# Patient Record
Sex: Female | Born: 1975 | Race: Black or African American | Hispanic: No | Marital: Married | Smoking: Never smoker
Health system: Southern US, Community
[De-identification: ages and names within clinical notes are randomized; demographics above are authoritative.]

## PROBLEM LIST (undated history)

## (undated) DIAGNOSIS — I10 Essential (primary) hypertension: Secondary | ICD-10-CM

## (undated) DIAGNOSIS — Z87442 Personal history of urinary calculi: Secondary | ICD-10-CM

## (undated) HISTORY — PX: NASAL SINUS SURGERY: SHX719

## (undated) HISTORY — PX: BREAST EXCISIONAL BIOPSY: SUR124

## (undated) HISTORY — PX: BREAST SURGERY: SHX581

---

## 2015-05-02 ENCOUNTER — Emergency Department (HOSPITAL_COMMUNITY)
Admission: EM | Admit: 2015-05-02 | Discharge: 2015-05-02 | Disposition: A | Discharge status: Home / Self Care | Payer: TRICARE For Life (TFL) | Attending: Emergency Medicine | Admitting: Emergency Medicine

## 2015-05-02 ENCOUNTER — Encounter (HOSPITAL_COMMUNITY): Payer: Self-pay | Admitting: Cardiology

## 2015-05-02 ENCOUNTER — Emergency Department (HOSPITAL_COMMUNITY): Payer: TRICARE For Life (TFL)

## 2015-05-02 DIAGNOSIS — I10 Essential (primary) hypertension: Secondary | ICD-10-CM | POA: Diagnosis not present

## 2015-05-02 DIAGNOSIS — Z8719 Personal history of other diseases of the digestive system: Secondary | ICD-10-CM | POA: Diagnosis not present

## 2015-05-02 DIAGNOSIS — Z880 Allergy status to penicillin: Secondary | ICD-10-CM | POA: Insufficient documentation

## 2015-05-02 DIAGNOSIS — R079 Chest pain, unspecified: Secondary | ICD-10-CM | POA: Insufficient documentation

## 2015-05-02 DIAGNOSIS — M25512 Pain in left shoulder: Secondary | ICD-10-CM | POA: Diagnosis not present

## 2015-05-02 HISTORY — DX: Essential (primary) hypertension: I10

## 2015-05-02 LAB — CBC
HCT: 38.2 % (ref 36.0–46.0)
HEMOGLOBIN: 12 g/dL (ref 12.0–15.0)
MCH: 23.4 pg — ABNORMAL LOW (ref 26.0–34.0)
MCHC: 31.4 g/dL (ref 30.0–36.0)
MCV: 74.6 fL — ABNORMAL LOW (ref 78.0–100.0)
Platelets: 198 10*3/uL (ref 150–400)
RBC: 5.12 MIL/uL — AB (ref 3.87–5.11)
RDW: 15.1 % (ref 11.5–15.5)
WBC: 4.5 10*3/uL (ref 4.0–10.5)

## 2015-05-02 LAB — I-STAT TROPONIN, ED
Troponin i, poc: 0 ng/mL (ref 0.00–0.08)
Troponin i, poc: 0 ng/mL (ref 0.00–0.08)

## 2015-05-02 LAB — BASIC METABOLIC PANEL
ANION GAP: 11 (ref 5–15)
CHLORIDE: 107 mmol/L (ref 101–111)
CO2: 23 mmol/L (ref 22–32)
CREATININE: 0.7 mg/dL (ref 0.44–1.00)
Calcium: 9.1 mg/dL (ref 8.9–10.3)
GFR calc Af Amer: >60 mL/min (ref >60)
GFR calc non Af Amer: >60 mL/min (ref >60)
Glucose, Bld: 86 mg/dL (ref 65–99)
Potassium: 3.8 mmol/L (ref 3.5–5.1)
Sodium: 141 mmol/L (ref 135–145)

## 2015-05-02 LAB — D-DIMER, QUANTITATIVE: D-Dimer, Quant: 0.34 ug/mL-FEU (ref 0.00–0.48)

## 2015-05-02 MED ORDER — IBUPROFEN 400 MG PO TABS
600.0000 mg | ORAL_TABLET | Freq: Once | ORAL | Status: AC
  Administered 2015-05-02: 600 mg via ORAL
  Filled 2015-05-02 (×2): qty 1

## 2015-05-02 MED ORDER — HYDROCODONE-ACETAMINOPHEN 5-325 MG PO TABS: 2.0000 | ORAL_TABLET | ORAL | Status: DC | PRN

## 2015-05-02 NOTE — ED Notes (Signed)
RN and tech attempted to draw lab. Phlebotomy aware and will attempt

## 2015-05-02 NOTE — ED Provider Notes (Signed)
CSN: [id number]     Arrival date & time 05/02/15  0744 History   First MD Initiated Contact with Patient 05/02/15 [phone]     Chief Complaint  Patient presents with  . Chest Pain    HPI   38 year old female presents today with today with chest pain and shoulder pain. Patient reports that on Friday she began developing lower chest wall pain, this was not associated with shortness of breath, nausea, vomiting, diaphoresis, positioning, exertion. She reports that the pain has waxed and waned throughout the weekend but notes this morning upon awakening she had left scapular pain that radiated down her left arm. Patient denies physical activity or exacerbating event, no trauma to the shoulder, no previous history of this sort of pain. She denies aggravating or relieving factors, she does not smoke, use drugs, no significant cardiac medical history. Patient denies fever, shortness of breath, unilateral leg swelling, history of malignancy, smoking, prolonged immobilization, recent surgeries, coagulopathy, recent estrogen use. Patient does note a history of GERD, not currently taking her medication, reports burning sensation in her upper abdomen from time to time.     Past Medical History  Diagnosis Date  . Hypertension    History reviewed. No pertinent past surgical history. History reviewed. No pertinent family history. History  Substance Use Topics  . Smoking status: Never Smoker   . Smokeless tobacco: Not on file  . Alcohol Use: No   OB History    No data available     Review of Systems  All other systems reviewed and are negative.   Allergies  Contrast media; Penicillins; Reglan; and Rocephin  Home Medications   Prior to Admission medications   Not on File   BP 139/77 mmHg  Pulse 91  Temp(Src) 98 F (36.7 C) (Oral)  Resp 13  Ht  (1.626 m)  Wt 170 lb (77.111 kg)  BMI 29.17 kg/m2  SpO2 99%  LMP 05/01/2015 Physical Exam  Constitutional: She is oriented to person, place,  and time. She appears well-developed and well-nourished.  HENT:  Head: Normocephalic and atraumatic.  Eyes: Conjunctivae are normal. Pupils are equal, round, and reactive to light. Right eye exhibits no discharge. Left eye exhibits no discharge. No scleral icterus.  Neck: Normal range of motion. Neck supple. No JVD present. No tracheal deviation present.  Cardiovascular: Regular rhythm, normal heart sounds and intact distal pulses.  Exam reveals no gallop and no friction rub.   No murmur heard. Pulmonary/Chest: Effort normal and breath sounds normal. No stridor.  Abdominal: Soft. Bowel sounds are normal. She exhibits no distension and no mass. There is no tenderness. There is no rebound and no guarding.  Musculoskeletal: She exhibits no edema or tenderness.  Distal extremities equal bilateral no signs of swelling or edema. Left scapula and surrounding tissues is physically tender to palpation, no signs of trauma, rash  Neurological: She is alert and oriented to person, place, and time. She has normal strength. She displays no atrophy and no tremor. No cranial nerve deficit or sensory deficit. She exhibits normal muscle tone. She displays no seizure activity. Coordination and gait normal. GCS eye subscore is 4. GCS verbal subscore is 5. GCS motor subscore is 6.  Upper/ lower strength 5/5   Skin: Skin is warm and dry.  Psychiatric: She has a normal mood and affect. Her behavior is normal. Judgment and thought content normal.  Nursing note and vitals reviewed.   ED Course  Procedures (including critical care time) Labs Review  Labs Reviewed  CBC  BASIC METABOLIC PANEL  Rosezena SensorI-STAT TROPOININ, ED    Imaging Review Dg Chest 2 View  05/02/2015   CLINICAL DATA:  Left-sided chest pain  EXAM: CHEST  2 VIEW  COMPARISON:  None.  FINDINGS: Lungs are clear. Heart size and pulmonary vascularity are normal. No adenopathy. There is slight upper thoracic levoscoliosis. No pneumothorax.  IMPRESSION: No edema or  consolidation.   Electronically Signed   By: Bretta BangWilliam  Woodruff III M.D.   On: 05/02/2015 08:31     EKG Interpretation   Date/Time:  Tuesday May 02 2015 07:53:28 EDT Ventricular Rate:  106 PR Interval:  146 QRS Duration: 70 QT Interval:  348 QTC Calculation: 462 R Axis:   81 Text Interpretation:  Sinus tachycardia Possible Left atrial enlargement  Borderline ECG Confirmed by HARRISON  MD, FORREST ([id number]) on 05/02/2015  9:05:00 AM      MDM   Final diagnoses:  Chest pain, unspecified chest pain type    Labs: I-STAT troponin 2, CBC, BMP, d-dimer  Imaging: DG chest no edema or consolidation no acute findings  Consults:  Therapeutics: Ibuprofen  Assessment: chest pain  Plan: Patient presents with chest pain 5 days, with the addition of left shoulder pain today. Patient has a heart score of 1, with 2 negative troponin's; chest pain is not typical for ACS highly unlikely. Patient was initially tachycardic, with a Wells criteria for PE of 1.5, with a negative d-dimer; highly unlikely for pulmonary embolism. DG chest showed no acute findings unlikely pulmonary source. Patient was tender to palpation over scapular area, denies trauma. Patient's chest pain is unlikely due to acute cause that would necessitate further evaluation or management at this time. She was given instructions to use ibuprofen as needed for the pain, strict return precautions the event new or worsening signs or symptoms presented. Patient verbalized her understanding and agreement to today's plan. She was instructed follow-up with her primary care provider for further evaluation and management. Patient was tachycardic during her stay, she reports this is her baseline as she nor longer takes her bi-systolic with previously controlled.      Eyvonne MechanicJeffrey Jasia Hiltunen, PA-C 05/02/15 1738  Purvis SheffieldForrest Harrison, MD 05/03/15 [phone]

## 2015-05-02 NOTE — ED Notes (Signed)
Pt reports left side chest pain that started on Friday. Reports the pain has continued to get worse and the left arm felt alittle numb this morning. Denies any n/v or SOB.

## 2015-05-02 NOTE — Discharge Instructions (Signed)
Chest Pain (Nonspecific) It is often hard to give a diagnosis for the cause of chest pain. There is always a chance that your pain could be related to something serious, such as a heart attack or a blood clot in the lungs. You need to follow up with your doctor. HOME CARE  If antibiotic medicine was given, take it as directed by your doctor. Finish the medicine even if you start to feel better.  For the next few days, avoid activities that bring on chest pain. Continue physical activities as told by your doctor.  Do not use any tobacco products. This includes cigarettes, chewing tobacco, and e-cigarettes.  Avoid drinking alcohol.  Only take medicine as told by your doctor.  Follow your doctor's suggestions for more testing if your chest pain does not go away.  Keep all doctor visits you made. GET HELP IF:  Your chest pain does not go away, even after treatment.  You have a rash with blisters on your chest.  You have a fever. GET HELP RIGHT AWAY IF:   You have more pain or pain that spreads to your arm, neck, jaw, back, or belly (abdomen).  You have shortness of breath.  You cough more than usual or cough up blood.  You have very bad back or belly pain.  You feel sick to your stomach (nauseous) or throw up (vomit).  You have very bad weakness.  You pass out (faint).  You have chills. This is an emergency. Do not wait to see if the problems will go away. Call your local emergency services (911 in [place] Do not drive yourself to the hospital. MAKE SURE YOU:   Understand these instructions.  Will watch your condition.  Will get help right away if you are not doing well or get worse. Document Released: 05/13/2008 Document Revised: 11/30/2013 Document Reviewed: 05/13/2008 [facility] Patient Information 2015 [employer], [place]. This information is not intended to replace advice given to you by your health care provider. Make sure you discuss any questions you have with your  health care provider.  Please read the above information. Please monitor for new or worsening signs or symptoms return immediately if any present. Please follow-up with your primary care provider in 3 days symptoms continue to persist, follow-up sooner as needed. Please use medication medication for breakthrough pain, ibuprofen or Tylenol can be used for water pain.

## 2015-08-10 ENCOUNTER — Other Ambulatory Visit: Payer: Self-pay | Admitting: Family Medicine

## 2015-08-10 DIAGNOSIS — Z1231 Encounter for screening mammogram for malignant neoplasm of breast: Secondary | ICD-10-CM

## 2016-01-02 ENCOUNTER — Encounter (HOSPITAL_COMMUNITY): Payer: Self-pay | Admitting: Emergency Medicine

## 2016-01-02 ENCOUNTER — Emergency Department (HOSPITAL_COMMUNITY)

## 2016-01-02 ENCOUNTER — Emergency Department (HOSPITAL_COMMUNITY)
Admission: EM | Admit: 2016-01-02 | Discharge: 2016-01-02 | Disposition: A | Discharge status: Home / Self Care | Attending: Emergency Medicine | Admitting: Emergency Medicine

## 2016-01-02 DIAGNOSIS — Z3202 Encounter for pregnancy test, result negative: Secondary | ICD-10-CM | POA: Diagnosis not present

## 2016-01-02 DIAGNOSIS — R1032 Left lower quadrant pain: Secondary | ICD-10-CM | POA: Diagnosis present

## 2016-01-02 DIAGNOSIS — Z79899 Other long term (current) drug therapy: Secondary | ICD-10-CM | POA: Diagnosis not present

## 2016-01-02 DIAGNOSIS — I10 Essential (primary) hypertension: Secondary | ICD-10-CM | POA: Insufficient documentation

## 2016-01-02 DIAGNOSIS — Z880 Allergy status to penicillin: Secondary | ICD-10-CM | POA: Insufficient documentation

## 2016-01-02 DIAGNOSIS — N200 Calculus of kidney: Secondary | ICD-10-CM | POA: Insufficient documentation

## 2016-01-02 HISTORY — DX: Personal history of urinary calculi: Z87.442

## 2016-01-02 LAB — URINALYSIS, ROUTINE W REFLEX MICROSCOPIC
Glucose, UA: NEGATIVE mg/dL
KETONES UR: 15 mg/dL — AB
NITRITE: NEGATIVE
Protein, ur: 100 mg/dL — AB
Specific Gravity, Urine: 1.033 — ABNORMAL HIGH (ref 1.005–1.030)
pH: 5 (ref 5.0–8.0)

## 2016-01-02 LAB — COMPREHENSIVE METABOLIC PANEL
ALBUMIN: 4.3 g/dL (ref 3.5–5.0)
ALK PHOS: 75 U/L (ref 38–126)
ALT: 14 U/L (ref 14–54)
AST: 19 U/L (ref 15–41)
Anion gap: 13 (ref 5–15)
BILIRUBIN TOTAL: 0.9 mg/dL (ref 0.3–1.2)
BUN: 10 mg/dL (ref 6–20)
CALCIUM: 9.3 mg/dL (ref 8.9–10.3)
CO2: 22 mmol/L (ref 22–32)
Chloride: 107 mmol/L (ref 101–111)
Creatinine, Ser: 0.91 mg/dL (ref 0.44–1.00)
GFR calc Af Amer: >60 mL/min (ref >60)
GLUCOSE: 164 mg/dL — AB (ref 65–99)
POTASSIUM: 3.4 mmol/L — AB (ref 3.5–5.1)
Sodium: 142 mmol/L (ref 135–145)
TOTAL PROTEIN: 8.1 g/dL (ref 6.5–8.1)

## 2016-01-02 LAB — CBC
HEMATOCRIT: 39 % (ref 36.0–46.0)
Hemoglobin: 12.5 g/dL (ref 12.0–15.0)
MCH: 24 pg — ABNORMAL LOW (ref 26.0–34.0)
MCHC: 32.1 g/dL (ref 30.0–36.0)
MCV: 75 fL — ABNORMAL LOW (ref 78.0–100.0)
Platelets: 202 10*3/uL (ref 150–400)
RBC: 5.2 MIL/uL — ABNORMAL HIGH (ref 3.87–5.11)
RDW: 16.1 % — ABNORMAL HIGH (ref 11.5–15.5)
WBC: 8.7 10*3/uL (ref 4.0–10.5)

## 2016-01-02 LAB — URINE MICROSCOPIC-ADD ON

## 2016-01-02 LAB — LIPASE, BLOOD: Lipase: 25 U/L (ref 11–51)

## 2016-01-02 LAB — POC URINE PREG, ED: PREG TEST UR: NEGATIVE

## 2016-01-02 MED ORDER — ONDANSETRON HCL 4 MG/2ML IJ SOLN
4.0000 mg | Freq: Once | INTRAMUSCULAR | Status: AC
  Administered 2016-01-02: 4 mg via INTRAVENOUS
  Filled 2016-01-02: qty 2

## 2016-01-02 MED ORDER — OXYCODONE-ACETAMINOPHEN 5-325 MG PO TABS: 1.0000 | ORAL_TABLET | Freq: Four times a day (QID) | ORAL | Status: DC | PRN

## 2016-01-02 MED ORDER — SODIUM CHLORIDE 0.9 % IV BOLUS (SEPSIS)
1000.0000 mL | Freq: Once | INTRAVENOUS | Status: AC
  Administered 2016-01-02: 1000 mL via INTRAVENOUS

## 2016-01-02 MED ORDER — MORPHINE SULFATE (PF) 4 MG/ML IV SOLN
4.0000 mg | Freq: Once | INTRAVENOUS | Status: DC
  Filled 2016-01-02: qty 1

## 2016-01-02 MED ORDER — KETOROLAC TROMETHAMINE 15 MG/ML IJ SOLN
15.0000 mg | Freq: Once | INTRAMUSCULAR | Status: AC
  Administered 2016-01-02: 15 mg via INTRAVENOUS
  Filled 2016-01-02: qty 1

## 2016-01-02 MED ORDER — MORPHINE SULFATE (PF) 4 MG/ML IV SOLN
4.0000 mg | Freq: Once | INTRAVENOUS | Status: AC
  Administered 2016-01-02: 4 mg via INTRAVENOUS
  Filled 2016-01-02: qty 1

## 2016-01-02 MED ORDER — TAMSULOSIN HCL 0.4 MG PO CAPS: 0.4000 mg | ORAL_CAPSULE | Freq: Every day | ORAL | Status: DC

## 2016-01-02 NOTE — ED Notes (Signed)
Pt. reports left abdominal pain with nausea and emesis onset Friday , denies diarrhea , no hematuria / no dysuria .

## 2016-01-02 NOTE — ED Provider Notes (Signed)
CSN: [id number]     Arrival date & time 01/02/16  4098 History   First MD Initiated Contact with Patient 01/02/16 1004     Chief Complaint  Patient presents with  . Abdominal Pain   (Consider location/radiation/quality/duration/timing/severity/associated sxs/prior Treatment) HPI  40 y.o. female with a hx of HTN, Kidneys stones, presents to the Emergency Department today complaining of LLQ abdominal pain since Friday with associated N/V, no hematemesis. The pain is localized with radiation to the L groin. Pain is 9/10 with constant sharp pain. Pt notes that it feels similar to kidney stone she has had 15 years ago. Pt having regular BMs with no diarrhea, no hematochezia or melena. No dysuria, vaginal discharge or bleeding noted. No CP, SOB, fevers.    Past Medical History  Diagnosis Date  . Hypertension   . H/O renal calculi    Past Surgical History  Procedure Laterality Date  . Breast surgery    . Nasal sinus surgery     No family history on file. Social History  Substance Use Topics  . Smoking status: Never Smoker   . Smokeless tobacco: None  . Alcohol Use: No   OB History    No data available     Review of Systems 10 Systems reviewed and all are negative for acute change except as noted in the HPI.  Allergies  Aciphex; Contrast media; Peanuts; Penicillins; Reglan; Rocephin; Shellfish allergy; and Tomato  Home Medications   Prior to Admission medications   Medication Sig Start Date End Date Taking? Authorizing Provider  HYDROcodone-acetaminophen (NORCO/VICODIN) 5-325 MG per tablet Take 2 tablets by mouth every 4 (four) hours as needed. 05/02/15   Eyvonne Mechanic, PA-C  ibuprofen (ADVIL,MOTRIN) 800 MG tablet Take 800 mg by mouth every 8 (eight) hours as needed for mild pain.    Historical Provider, MD  loratadine (CLARITIN) 10 MG tablet Take 10 mg by mouth daily.    Historical Provider, MD  Multiple Vitamins-Minerals (MULTIVITAMIN ADULT PO) Take 2 tablets by mouth daily.     Historical Provider, MD   BP 142/83 mmHg  Pulse 77  Temp(Src) 98.1 F (36.7 C) (Oral)  Resp 16  Ht  (1.626 m)  Wt 79.379 kg  BMI 30.02 kg/m2  SpO2 100%  LMP 12/03/2015 (Approximate)   Physical Exam  Constitutional: She is oriented to person, place, and time. She appears well-developed and well-nourished.  HENT:  Head: Normocephalic and atraumatic.  Eyes: EOM are normal.  Neck: Neck supple.  Cardiovascular: Normal rate, regular rhythm and normal heart sounds.   Pulmonary/Chest: Effort normal and breath sounds normal.  Abdominal: Soft. Normal appearance and bowel sounds are normal. She exhibits no distension. There is tenderness in the left lower quadrant. There is no rebound, no guarding, no CVA tenderness, no tenderness at McBurney's point and negative Murphy's sign.  Musculoskeletal: Normal range of motion.  Neurological: She is alert and oriented to person, place, and time.  Skin: Skin is warm and dry.  Psychiatric: She has a normal mood and affect. Her behavior is normal. Thought content normal.  Nursing note and vitals reviewed.  ED Course  Procedures (including critical care time) Labs Review Labs Reviewed  COMPREHENSIVE METABOLIC PANEL - Abnormal; Notable for the following:    Potassium 3.4 (*)    Glucose, Bld 164 (*)    All other components within normal limits  CBC - Abnormal; Notable for the following:    RBC 5.20 (*)    MCV 75.0 (*)  MCH 24.0 (*)    RDW 16.1 (*)    All other components within normal limits  URINALYSIS, ROUTINE W REFLEX MICROSCOPIC (NOT AT [facility]) - Abnormal; Notable for the following:    Color, Urine RED (*)    APPearance TURBID (*)    Specific Gravity, Urine 1.033 (*)    Hgb urine dipstick LARGE (*)    Bilirubin Urine MODERATE (*)    Ketones, ur 15 (*)    Protein, ur 100 (*)    Leukocytes, UA SMALL (*)    All other components within normal limits  URINE MICROSCOPIC-ADD ON - Abnormal; Notable for the following:    Squamous  Epithelial / LPF TOO NUMEROUS TO COUNT (*)    Bacteria, UA MANY (*)    Casts HYALINE CASTS (*)    All other components within normal limits  URINE CULTURE  LIPASE, BLOOD  POC URINE PREG, ED   Imaging Review Ct Renal Stone Study  01/02/2016  CLINICAL DATA:  Left lower quadrant region pain for 5 days EXAM: CT ABDOMEN AND PELVIS WITHOUT CONTRAST TECHNIQUE: Multidetector CT imaging of the abdomen and pelvis was performed following the standard protocol without oral or intravenous contrast material administration. COMPARISON:  None. FINDINGS: Lower chest:  Visualized lung bases are clear. Hepatobiliary: No focal liver lesions are identified on this noncontrast enhanced study. There is a Riedel's lobe on the right, an anatomic variant. There is no appreciable gallbladder wall thickening. There is no biliary duct dilatation. Pancreas: No pancreatic mass or inflammatory focus. Spleen: No splenic lesions are identified. Adrenals/Urinary Tract: Adrenals appear normal bilaterally. There is no appreciable renal mass on either side. Left kidney is edematous. There is no hydronephrosis on the right. There is mild to moderate hydronephrosis on the left. There is no intrarenal calculus on either side. On the right, there is no demonstrable ureteral calculus. On the left, there is a calculus measuring 4 x 4 mm slightly superior to the left acetabulum in the distal left ureter. No other ureteral calculi are identified. The urinary bladder is midline with wall thickness within normal limits. Stomach/Bowel: There is no bowel wall or mesenteric thickening. No bowel obstruction. No free air or portal venous air. Vascular/Lymphatic: There is no demonstrable abdominal aortic aneurysm. No vascular lesions are identified on this noncontrast enhanced study. There is no demonstrable adenopathy in the abdomen or pelvis. There are scattered inguinal lymph nodes which do not meet size criteria for pathologic significance. Reproductive:  Uterus is anteverted. There is no pelvic mass or pelvic fluid collection. Other: Appendix appears normal. No abscess or ascites is apparent in the abdomen or pelvis. There is a small ventral hernia containing only fat. Musculoskeletal: There are no blastic or lytic bone lesions. No intramuscular or abdominal wall lesions. IMPRESSION: 4 x 4 mm calculus distal left ureter with mild to moderate hydronephrosis on the left. There is moderate edema in the left perirenal region. Small ventral hernia containing only fat. No bowel obstruction.  No abscess.  Appendix appears normal. Electronically Signed   By: Bretta Bang III M.D.   On: 01/02/2016 11:56   I have personally reviewed and evaluated these images and lab results as part of my medical decision-making.   EKG Interpretation None      MDM  I have reviewed relevant laboratory values.I have reviewed relevant imaging studies.I have reviewed the relevant previous healthcare records.I obtained HPI from historian. Patient discussed with supervising physician  ED Course: CT Renal Study  Assessment: 40y F  with hx nephrolithiasis presents with LLQ pain since Friday. UA showed Hematuria. Ordered CT renal stone study for evaluation, which revealed 4x51mm calculus L side. Will treat with outpatient analgesia and flomax. Urine culture was obtained. Told pt to follow up with ED or PCP in the next 28-72 hours for results of culture and potential ABX therapy.  Patient is in no acute distress. Vital Signs are stable. Afebrile. Patient is able to ambulate. Patient able to tolerate PO.   Disposition/Plan:  DC Home Additional Verbal discharge instructions given and discussed with patient.  Pt Instructed to f/u with PCP in the next 48-72 hours for evaluation and treatment of symptoms. Return precautions given Pt acknowledges and agrees with plan   Supervising Physician Nelva Nay, MD   Final diagnoses:  LLQ abdominal pain  Nephrolithiasis       Audry Pili, PA-C 01/02/16 1229  Nelva Nay, MD 01/03/16 [phone]

## 2016-01-02 NOTE — Discharge Instructions (Signed)
Please read and follow all provided instructions.  Your diagnoses today include:  1. Nephrolithiasis   2. LLQ abdominal pain    Tests performed today include:  Urine test that showed blood in your urine and no infection  CT scan which showed a 4x4 millimeter kidney on the left side  Blood test that showed normal kidney function  Vital signs. See below for your results today.   Medications prescribed:   Take any prescribed medications only as directed.  Home care instructions:  Follow any educational materials contained in this packet.  Please double your fluid intake for the next several days. Strain your urine and save any stones that may pass.   BE VERY CAREFUL not to take multiple medicines containing Tylenol (also called acetaminophen). Doing so can lead to an overdose which can damage your liver and cause liver failure and possibly death.   Follow-up instructions: Please follow-up with your primary care provider for results of urine culture OR you can call the ED for results in 48-72 hours. You may need antibiotics if the culture is positive.   If you need to return to the Emergency Department, go to [facility] and not [facility]. The urologists are located at [facility] and can better care for you at this location.  Return instructions:  If you need to return to the Emergency Department, go to [facility] and not [facility]. The urologists are located at [facility] and can better care for you at this location.   Please return to the Emergency Department if you experience worsening symptoms.  Please return if you develop fever or uncontrolled pain or vomiting.  Please return if you have any other emergent concerns.  Additional Information:  Your vital signs today were: BP 132/86 mmHg   Pulse 97   Temp(Src) 98.1 F (36.7 C) (Oral)   Resp 16   Ht  (1.626 m)   Wt 79.379 kg   BMI 30.02 kg/m2   SpO2 100%   LMP 12/06/2015 If  your blood pressure (BP) was elevated above 135/85 this visit, please have this repeated by your doctor within one month. --------------

## 2016-01-03 LAB — URINE CULTURE

## 2016-02-13 ENCOUNTER — Ambulatory Visit
Admission: RE | Admit: 2016-02-13 | Discharge: 2016-02-13 | Disposition: A | Discharge status: Home / Self Care | Source: Ambulatory Visit | Attending: Family Medicine | Admitting: Family Medicine

## 2016-02-13 DIAGNOSIS — Z1231 Encounter for screening mammogram for malignant neoplasm of breast: Secondary | ICD-10-CM

## 2016-02-20 ENCOUNTER — Other Ambulatory Visit: Payer: Self-pay | Admitting: Family Medicine

## 2016-02-20 DIAGNOSIS — R928 Other abnormal and inconclusive findings on diagnostic imaging of breast: Secondary | ICD-10-CM

## 2016-03-13 ENCOUNTER — Ambulatory Visit
Admission: RE | Admit: 2016-03-13 | Discharge: 2016-03-13 | Disposition: A | Discharge status: Home / Self Care | Source: Ambulatory Visit | Attending: Family Medicine | Admitting: Family Medicine

## 2016-03-13 DIAGNOSIS — R928 Other abnormal and inconclusive findings on diagnostic imaging of breast: Secondary | ICD-10-CM

## 2017-04-04 ENCOUNTER — Other Ambulatory Visit: Payer: Self-pay | Admitting: Family Medicine

## 2017-04-04 DIAGNOSIS — Z1231 Encounter for screening mammogram for malignant neoplasm of breast: Secondary | ICD-10-CM

## 2017-05-02 ENCOUNTER — Ambulatory Visit
Admission: RE | Admit: 2017-05-02 | Discharge: 2017-05-02 | Disposition: A | Discharge status: Home / Self Care | Source: Ambulatory Visit | Attending: Family Medicine | Admitting: Family Medicine

## 2017-05-02 ENCOUNTER — Other Ambulatory Visit: Payer: Self-pay | Admitting: Family Medicine

## 2017-05-02 DIAGNOSIS — Z1231 Encounter for screening mammogram for malignant neoplasm of breast: Secondary | ICD-10-CM

## 2018-01-21 ENCOUNTER — Other Ambulatory Visit: Payer: Self-pay | Admitting: Family Medicine

## 2018-01-21 DIAGNOSIS — Z1231 Encounter for screening mammogram for malignant neoplasm of breast: Secondary | ICD-10-CM

## 2018-03-07 ENCOUNTER — Emergency Department (HOSPITAL_COMMUNITY)

## 2018-03-07 ENCOUNTER — Emergency Department (HOSPITAL_COMMUNITY)
Admission: EM | Admit: 2018-03-07 | Discharge: 2018-03-07 | Disposition: A | Discharge status: Home / Self Care | Attending: Emergency Medicine | Admitting: Emergency Medicine

## 2018-03-07 ENCOUNTER — Encounter (HOSPITAL_COMMUNITY): Payer: Self-pay

## 2018-03-07 DIAGNOSIS — R000 Tachycardia, unspecified: Secondary | ICD-10-CM

## 2018-03-07 DIAGNOSIS — Z79899 Other long term (current) drug therapy: Secondary | ICD-10-CM | POA: Insufficient documentation

## 2018-03-07 DIAGNOSIS — I160 Hypertensive urgency: Secondary | ICD-10-CM | POA: Insufficient documentation

## 2018-03-07 DIAGNOSIS — R030 Elevated blood-pressure reading, without diagnosis of hypertension: Secondary | ICD-10-CM | POA: Diagnosis present

## 2018-03-07 LAB — BASIC METABOLIC PANEL
ANION GAP: 11 (ref 5–15)
BUN: 8 mg/dL (ref 6–20)
CHLORIDE: 104 mmol/L (ref 101–111)
CO2: 23 mmol/L (ref 22–32)
Calcium: 8.8 mg/dL — ABNORMAL LOW (ref 8.9–10.3)
Creatinine, Ser: 0.7 mg/dL (ref 0.44–1.00)
Glucose, Bld: 112 mg/dL — ABNORMAL HIGH (ref 65–99)
POTASSIUM: 4.7 mmol/L (ref 3.5–5.1)
SODIUM: 138 mmol/L (ref 135–145)

## 2018-03-07 LAB — CBC
HEMATOCRIT: 38.9 % (ref 36.0–46.0)
HEMOGLOBIN: 12 g/dL (ref 12.0–15.0)
MCH: 23.8 pg — ABNORMAL LOW (ref 26.0–34.0)
MCHC: 30.8 g/dL (ref 30.0–36.0)
MCV: 77.2 fL — AB (ref 78.0–100.0)
Platelets: 194 10*3/uL (ref 150–400)
RBC: 5.04 MIL/uL (ref 3.87–5.11)
RDW: 14.7 % (ref 11.5–15.5)
WBC: 4.7 10*3/uL (ref 4.0–10.5)

## 2018-03-07 LAB — I-STAT TROPONIN, ED: Troponin i, poc: 0 ng/mL (ref 0.00–0.08)

## 2018-03-07 LAB — TSH: TSH: 0.766 u[IU]/mL (ref 0.350–4.500)

## 2018-03-07 MED ORDER — METOPROLOL TARTRATE 25 MG PO TABS: 25.0000 mg | ORAL_TABLET | Freq: Two times a day (BID) | ORAL | 0 refills | Status: AC

## 2018-03-07 MED ORDER — METOPROLOL TARTRATE 25 MG PO TABS
25.0000 mg | ORAL_TABLET | Freq: Once | ORAL | Status: AC
Start: 2018-03-07 — End: 2018-03-07
  Administered 2018-03-07: 25 mg via ORAL
  Filled 2018-03-07: qty 1

## 2018-03-07 NOTE — ED Triage Notes (Signed)
Patient complains of bp spikes for several days, was originally only taking BP meds every other day and now taking daily. Also reports some intermittent heart racing x 3 days, no pain

## 2018-03-07 NOTE — Discharge Instructions (Addendum)
Please take metoprolol twice daily along with your medications.  Follow up closely with your doctor for further care.

## 2018-03-07 NOTE — ED Provider Notes (Signed)
[facility] EMERGENCY DEPARTMENT Provider Note   CSN: [id number] Arrival date & time: 03/07/18  4098     History   Chief Complaint Chief Complaint  Patient presents with  . Hypertension    HPI Erica Mendez is a 42 y.o. female.  HPI   42 year old female with history of hypertension presenting complaining of high blood pressure and elevated heart rate.  Patient states she has history of high blood pressures since the age of 14.  She also has history of elevated heart rate usually baseline at 112.  She was seen by her PCP earlier this week for regular follow-up and was noted that her blood pressure was high.  She takes Micardis 40 mg daily but admits to only taking it every other day due to side effect of gaining weight.  After her doctor's visits, patient has been taking her medication as prescribed, and check her blood pressure on a daily basis.  States that it is now running around 140-150 systolic however last night, while she was at the comedy club, she felt her heart racing.  She mentioned when checked, her heart rates was in the 150s.  She denies any associated headache, diplopia, confusion, lightheadedness or dizziness, neck pain, chest pain, trouble breathing, abdominal pain, nausea vomiting diarrhea.  She does admits to having increased stress lately.  She denies any prior history of PE or DVT, no recent surgery, prolonged bed rest, leg swelling or calf pain, active cancer, hemoptysis, or using oral birth control.  Past Medical History:  Diagnosis Date  . H/O renal calculi   . Hypertension     There are no active problems to display for this patient.   Past Surgical History:  Procedure Laterality Date  . BREAST EXCISIONAL BIOPSY Right   . BREAST SURGERY    . NASAL SINUS SURGERY       OB History   None      Home Medications    Prior to Admission medications   Medication Sig Start Date End Date Taking? Authorizing Provider  FIBER SELECT  GUMMIES CHEW Chew 2 tablets by mouth daily.    [provider]  HYDROcodone-acetaminophen (NORCO/VICODIN) 5-325 MG per tablet Take 2 tablets by mouth every 4 (four) hours as needed. Patient not taking: Reported on 01/02/2016 05/02/15   Hedges, Tinnie Gens, PA-C  loratadine (CLARITIN) 10 MG tablet Take 10 mg by mouth daily.    [provider]  Multiple Vitamins-Minerals (MULTIVITAMIN ADULT PO) Take 2 tablets by mouth daily.    [provider]  oxyCODONE-acetaminophen (PERCOCET/ROXICET) 5-325 MG tablet Take 1 tablet by mouth every 6 (six) hours as needed for severe pain. 01/02/16   Audry Pili, PA-C  tamsulosin (FLOMAX) 0.4 MG CAPS capsule Take 1 capsule (0.4 mg total) by mouth daily. 01/02/16   Audry Pili, PA-C  telmisartan (MICARDIS) 40 MG tablet Take 40 mg by mouth daily. 12/12/15 12/11/16  [provider]    Family History No family history on file.  Social History Social History   Tobacco Use  . Smoking status: Never Smoker  . Smokeless tobacco: Never Used  Substance Use Topics  . Alcohol use: No  . Drug use: No     Allergies   Aciphex [rabeprazole]; Contrast media [iodinated diagnostic agents]; Peanuts [peanut oil]; Penicillins; Reglan [metoclopramide]; Rocephin [ceftriaxone]; Shellfish allergy; and Tomato   Review of Systems Review of Systems  All other systems reviewed and are negative.    Physical Exam Updated Vital Signs BP Marland Kitchen)  174/102   Pulse (!) 132   Temp 98.7 F (37.1 C) (Oral)   Resp 18   LMP 02/20/2018   SpO2 100%   Physical Exam  Constitutional: She is oriented to person, place, and time. She appears well-developed and well-nourished. No distress.  HENT:  Head: Atraumatic.  Eyes: Conjunctivae are normal.  Neck: Normal range of motion. Neck supple. No JVD present.  No nuchal rigidity  Cardiovascular: Normal rate and regular rhythm.  Pulmonary/Chest: Effort normal and breath sounds normal. No respiratory distress. She exhibits  no tenderness.  Abdominal: Soft. She exhibits no distension.  Musculoskeletal: She exhibits no edema.  Neurological: She is alert and oriented to person, place, and time.  Skin: No rash noted.  Psychiatric: She has a normal mood and affect.  Nursing note and vitals reviewed.    ED Treatments / Results  Labs (all labs ordered are listed, but only abnormal results are displayed) Labs Reviewed  BASIC METABOLIC PANEL - Abnormal; Notable for the following components:      Result Value   Glucose, Bld 112 (*)    Calcium 8.8 (*)    All other components within normal limits  CBC - Abnormal; Notable for the following components:   MCV 77.2 (*)    MCH 23.8 (*)    All other components within normal limits  TSH  I-STAT TROPONIN, ED    EKG EKG Interpretation  Date/Time:  Saturday March 07 2018 10:28:00 EDT Ventricular Rate:  115 PR Interval:  156 QRS Duration: 66 QT Interval:  344 QTC Calculation: 475 R Axis:   88 Text Interpretation:  Sinus tachycardia Biatrial enlargement Low voltage QRS Abnormal ECG No significant change since last tracing Confirmed by Alvira MondaySchlossman, Erin ([id number]) on 03/07/2018 1:37:44 PM      Radiology Dg Chest 2 View  Result Date: 03/07/2018 CLINICAL DATA:  Tachycardia. EXAM: CHEST - 2 VIEW COMPARISON:  May 02, 2015 FINDINGS: Mild stable cardiomegaly. The hila and mediastinum are normal. The lungs are clear. IMPRESSION: No active cardiopulmonary disease. Electronically Signed   By: Gerome Samavid  Williams III M.D   On: 03/07/2018 10:58    Procedures Procedures (including critical care time)  Medications Ordered in ED Medications  metoprolol tartrate (LOPRESSOR) tablet 25 mg (25 mg Oral Given 03/07/18 1423)     Initial Impression / Assessment and Plan / ED Course  I have reviewed the triage vital signs and the nursing notes.  Pertinent labs & imaging results that were available during my care of the patient were reviewed by me and considered in my medical decision  making (see chart for details).     BP (!) 142/98   Pulse 96   Temp 98.7 F (37.1 C) (Oral)   Resp 18   LMP 02/20/2018   SpO2 100%    Final Clinical Impressions(s) / ED Diagnoses   Final diagnoses:  Hypertensive urgency  Tachycardia    ED Discharge Orders        Ordered    metoprolol tartrate (LOPRESSOR) 25 MG tablet  2 times daily     03/07/18 1508     Patient with history of tachycardia and hypertension here with worsening tachycardia and hypertension.  She has not been compliant with her medication, Micardis until this week.  Currently she does not exhibit symptoms concerning for hypertensive emergency.  She report her baseline heart rate is usually 112-114.  At one point her heart rate was 135 showing sinus tachycardia and no evidence of SVT or A. fib.  Her labs are reassuring.  Patient given metoprolol 25 mg p.o.  Her blood pressure did improve to 142/98, with a heart rate of 96.  At this time, patient will be discharged home with metoprolol and close follow-up with PCP for further management of her condition.  A TSH was obtained but have not resulted yet.  Patient can call for results.     Fayrene Helper, PA-C 03/07/18 1509    Alvira Monday, MD 03/09/18 [phone]

## 2018-03-07 NOTE — ED Notes (Signed)
Patient transported to X-ray 

## 2018-03-07 NOTE — ED Notes (Signed)
Pt called x3 for hallway bed no answer

## 2018-05-05 ENCOUNTER — Ambulatory Visit

## 2018-05-12 ENCOUNTER — Ambulatory Visit
Admission: RE | Admit: 2018-05-12 | Discharge: 2018-05-12 | Disposition: A | Discharge status: Home / Self Care | Source: Ambulatory Visit | Attending: Family Medicine | Admitting: Family Medicine

## 2018-05-12 DIAGNOSIS — Z1231 Encounter for screening mammogram for malignant neoplasm of breast: Secondary | ICD-10-CM

## 2020-01-17 IMAGING — MG DIGITAL SCREENING BILATERAL MAMMOGRAM WITH CAD
4 series · 4 of 4 positions shown · non-contrast
Comparison: Previous exam(s).

CLINICAL DATA: Screening.

EXAM:
DIGITAL SCREENING BILATERAL MAMMOGRAM WITH CAD

[L MLO]
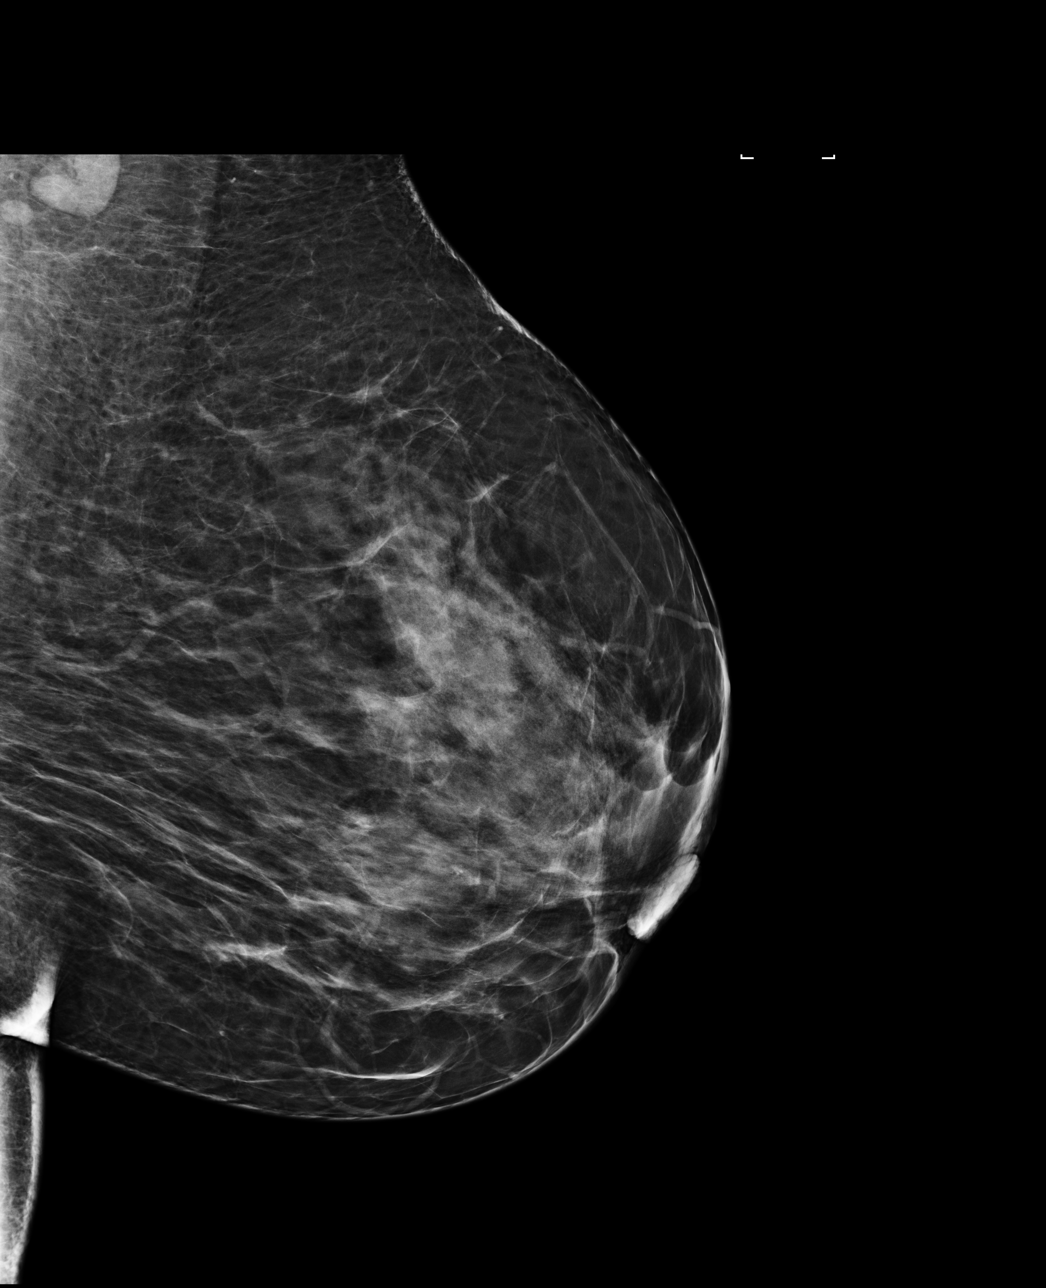

[R CC]
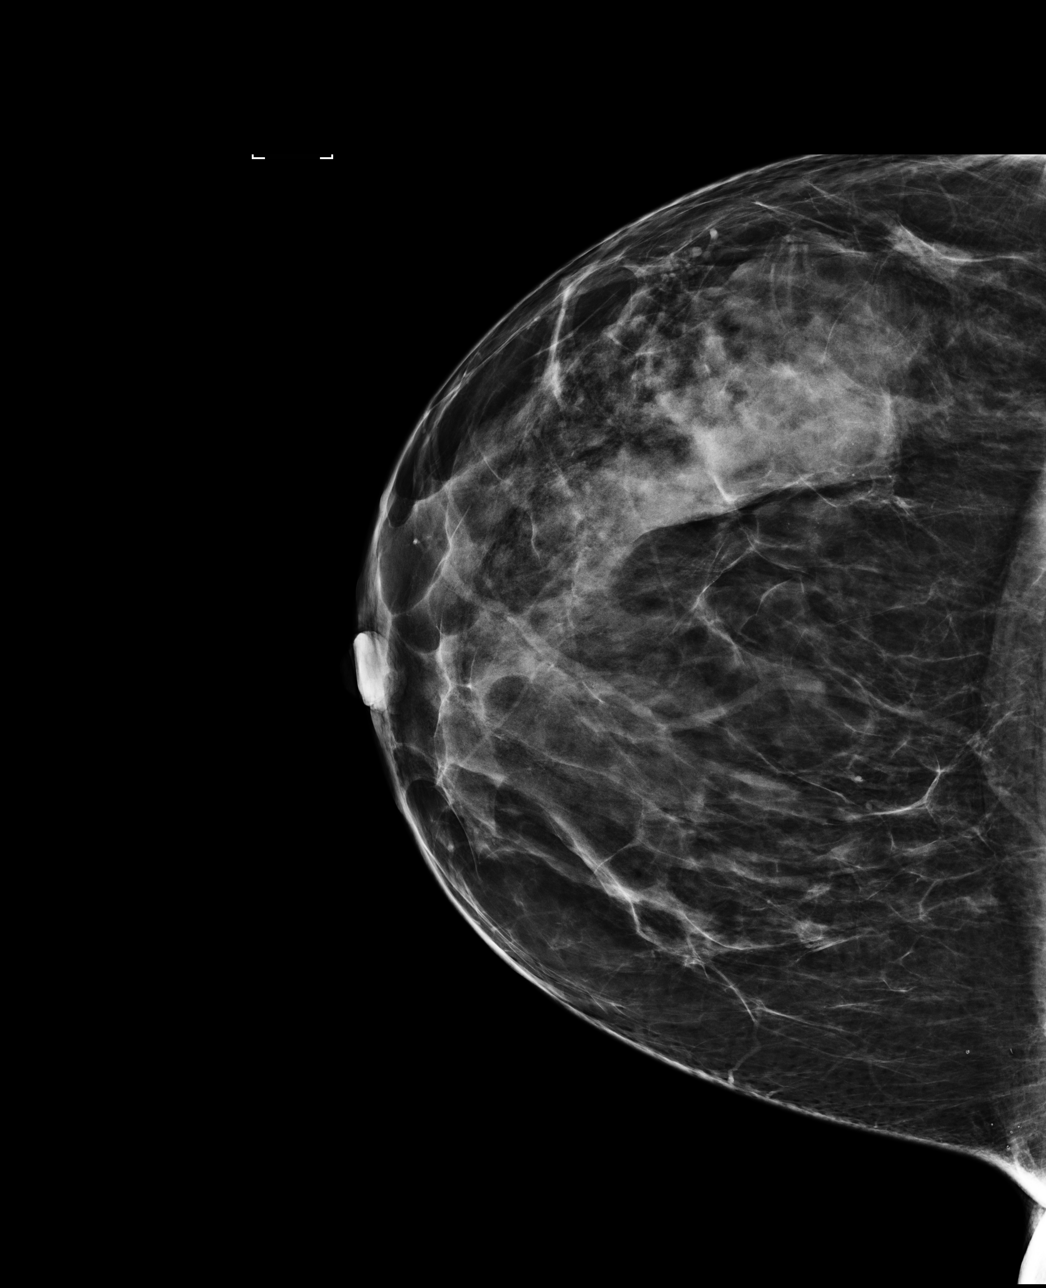

[L CC]
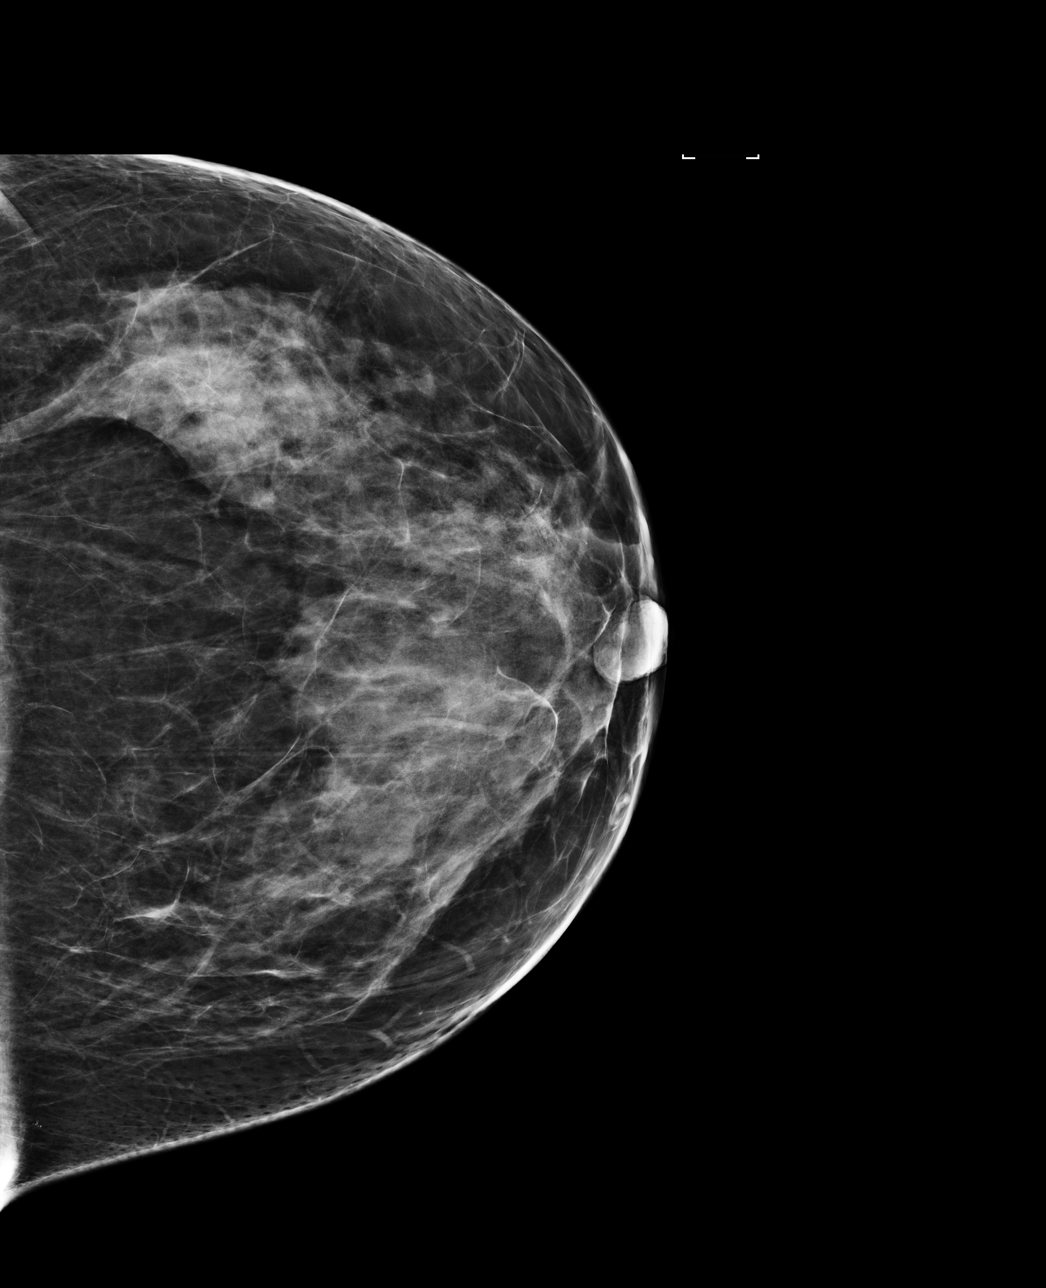

[R MLO]
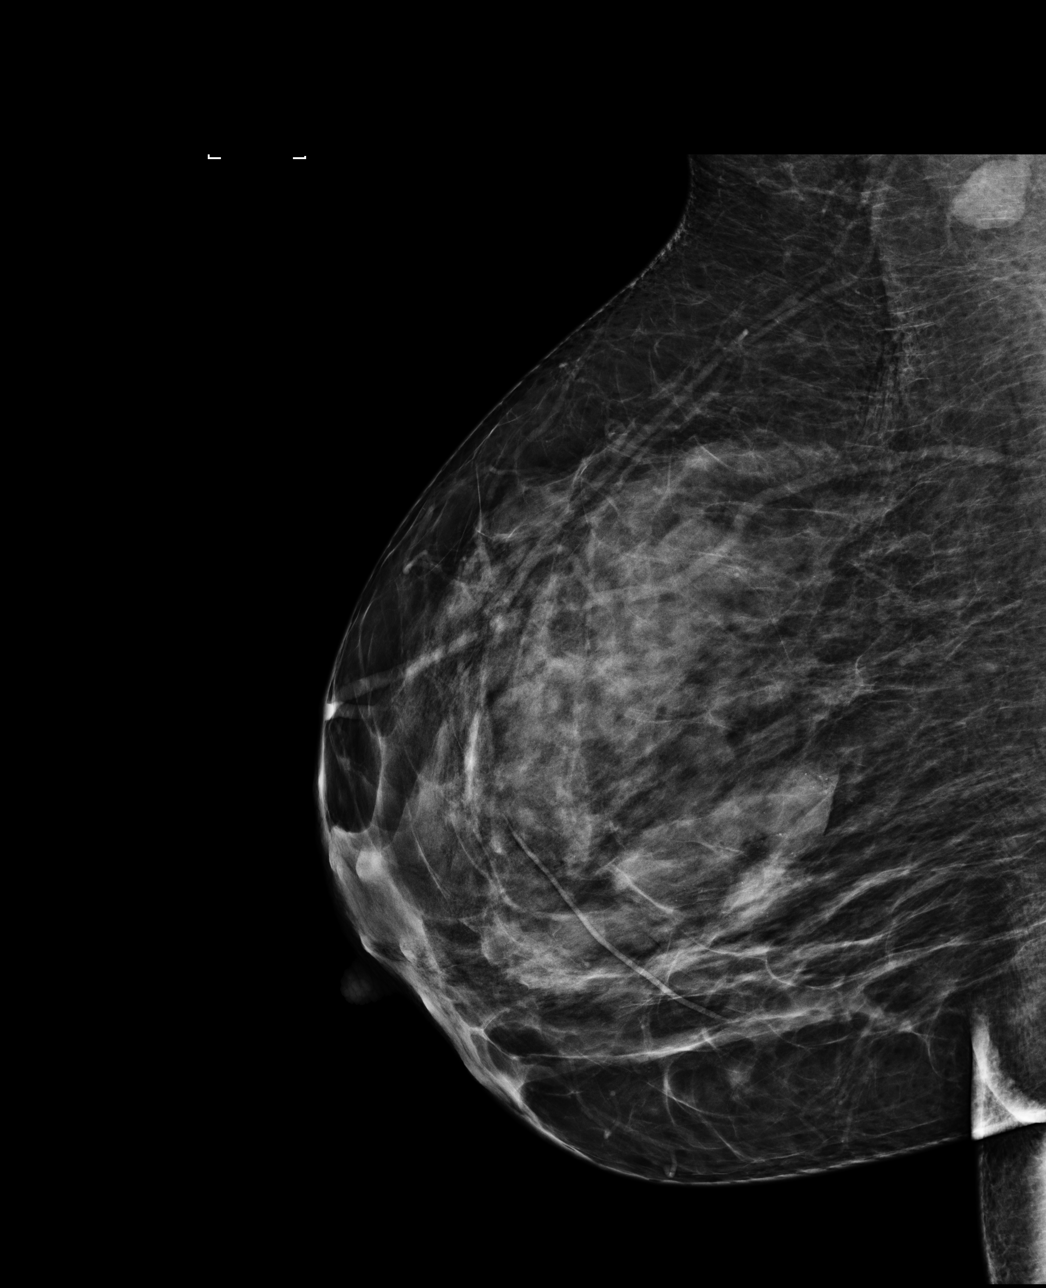

[4 of 4 positions shown; findings below may reference images not displayed]

ACR Breast Density Category c: The breast tissue is heterogeneously
dense, which may obscure small masses.
FINDINGS: There are no findings suspicious for malignancy. Images were
processed with CAD.
IMPRESSION: No mammographic evidence of malignancy. A result letter of this
screening mammogram will be mailed directly to the patient.

RECOMMENDATION:
Screening mammogram in one year. (Code:YJ-[id number])

BI-RADS CATEGORY  1: Negative.
